# Patient Record
Sex: Female | Born: 1950 | Race: White | Hispanic: No | Marital: Married
Health system: Southern US, Community
[De-identification: ages and names within clinical notes are randomized; demographics above are authoritative.]

---

## 1997-12-22 ENCOUNTER — Ambulatory Visit (HOSPITAL_COMMUNITY): Admission: RE | Admit: 1997-12-22 | Discharge: 1997-12-22 | Payer: Self-pay | Admitting: Obstetrics and Gynecology

## 1999-05-24 ENCOUNTER — Encounter: Payer: Self-pay | Admitting: Obstetrics and Gynecology

## 1999-05-24 ENCOUNTER — Ambulatory Visit (HOSPITAL_COMMUNITY): Admission: RE | Admit: 1999-05-24 | Discharge: 1999-05-24 | Payer: Self-pay | Admitting: Obstetrics and Gynecology

## 2000-04-26 ENCOUNTER — Other Ambulatory Visit: Admission: RE | Admit: 2000-04-26 | Discharge: 2000-04-26 | Payer: Self-pay | Admitting: Obstetrics and Gynecology

## 2001-05-27 ENCOUNTER — Other Ambulatory Visit: Admission: RE | Admit: 2001-05-27 | Discharge: 2001-05-27 | Payer: Self-pay | Admitting: Obstetrics and Gynecology

## 2010-02-10 ENCOUNTER — Encounter
Admission: RE | Admit: 2010-02-10 | Discharge: 2010-02-10 | Payer: Self-pay | Source: Home / Self Care | Attending: Family Medicine | Admitting: Family Medicine

## 2010-02-18 ENCOUNTER — Encounter
Admission: RE | Admit: 2010-02-18 | Discharge: 2010-02-18 | Payer: Self-pay | Source: Home / Self Care | Attending: Family Medicine | Admitting: Family Medicine

## 2011-05-16 ENCOUNTER — Other Ambulatory Visit: Payer: Self-pay | Admitting: Family Medicine

## 2011-05-16 DIAGNOSIS — Z1231 Encounter for screening mammogram for malignant neoplasm of breast: Secondary | ICD-10-CM

## 2011-05-22 ENCOUNTER — Ambulatory Visit
Admission: RE | Admit: 2011-05-22 | Discharge: 2011-05-22 | Disposition: A | Discharge status: Home / Self Care | Payer: BC Managed Care – PPO | Source: Ambulatory Visit | Attending: Family Medicine | Admitting: Family Medicine

## 2011-05-22 DIAGNOSIS — Z1231 Encounter for screening mammogram for malignant neoplasm of breast: Secondary | ICD-10-CM

## 2012-04-29 ENCOUNTER — Other Ambulatory Visit: Payer: Self-pay

## 2012-04-29 DIAGNOSIS — Z1231 Encounter for screening mammogram for malignant neoplasm of breast: Secondary | ICD-10-CM

## 2012-06-11 ENCOUNTER — Ambulatory Visit
Admission: RE | Admit: 2012-06-11 | Discharge: 2012-06-11 | Disposition: A | Discharge status: Home / Self Care | Payer: BC Managed Care – PPO | Source: Ambulatory Visit

## 2012-06-11 DIAGNOSIS — Z1231 Encounter for screening mammogram for malignant neoplasm of breast: Secondary | ICD-10-CM

## 2012-06-18 ENCOUNTER — Other Ambulatory Visit: Payer: Self-pay | Admitting: Family Medicine

## 2012-06-18 ENCOUNTER — Ambulatory Visit
Admission: RE | Admit: 2012-06-18 | Discharge: 2012-06-18 | Disposition: A | Discharge status: Home / Self Care | Payer: BC Managed Care – PPO | Source: Ambulatory Visit | Attending: Family Medicine | Admitting: Family Medicine

## 2012-06-18 DIAGNOSIS — R52 Pain, unspecified: Secondary | ICD-10-CM

## 2012-06-18 DIAGNOSIS — R609 Edema, unspecified: Secondary | ICD-10-CM

## 2013-05-22 ENCOUNTER — Other Ambulatory Visit: Payer: Self-pay

## 2013-05-22 DIAGNOSIS — Z1231 Encounter for screening mammogram for malignant neoplasm of breast: Secondary | ICD-10-CM

## 2013-05-26 ENCOUNTER — Encounter (INDEPENDENT_AMBULATORY_CARE_PROVIDER_SITE_OTHER): Payer: BC Managed Care – PPO | Admitting: Ophthalmology

## 2013-05-26 DIAGNOSIS — H2510 Age-related nuclear cataract, unspecified eye: Secondary | ICD-10-CM

## 2013-05-26 DIAGNOSIS — H43819 Vitreous degeneration, unspecified eye: Secondary | ICD-10-CM

## 2013-05-26 DIAGNOSIS — H35449 Age-related reticular degeneration of retina, unspecified eye: Secondary | ICD-10-CM

## 2013-06-16 ENCOUNTER — Ambulatory Visit
Admission: RE | Admit: 2013-06-16 | Discharge: 2013-06-16 | Disposition: A | Discharge status: Home / Self Care | Payer: BC Managed Care – PPO | Source: Ambulatory Visit

## 2013-06-16 ENCOUNTER — Encounter (INDEPENDENT_AMBULATORY_CARE_PROVIDER_SITE_OTHER): Payer: Self-pay

## 2013-06-16 DIAGNOSIS — Z1231 Encounter for screening mammogram for malignant neoplasm of breast: Secondary | ICD-10-CM

## 2013-11-24 ENCOUNTER — Ambulatory Visit (INDEPENDENT_AMBULATORY_CARE_PROVIDER_SITE_OTHER): Payer: BC Managed Care – PPO | Admitting: Ophthalmology

## 2014-02-02 ENCOUNTER — Ambulatory Visit
Admission: RE | Admit: 2014-02-02 | Discharge: 2014-02-02 | Disposition: A | Discharge status: Home / Self Care | Payer: BC Managed Care – PPO | Source: Ambulatory Visit | Attending: Family Medicine | Admitting: Family Medicine

## 2014-02-02 ENCOUNTER — Other Ambulatory Visit: Payer: Self-pay | Admitting: Family Medicine

## 2014-02-02 DIAGNOSIS — R05 Cough: Secondary | ICD-10-CM

## 2014-02-02 DIAGNOSIS — R059 Cough, unspecified: Secondary | ICD-10-CM

## 2014-07-01 DIAGNOSIS — J309 Allergic rhinitis, unspecified: Secondary | ICD-10-CM | POA: Diagnosis not present

## 2014-07-27 DIAGNOSIS — M2022 Hallux rigidus, left foot: Secondary | ICD-10-CM | POA: Diagnosis not present

## 2014-07-29 DIAGNOSIS — Z131 Encounter for screening for diabetes mellitus: Secondary | ICD-10-CM | POA: Diagnosis not present

## 2014-07-29 DIAGNOSIS — Z136 Encounter for screening for cardiovascular disorders: Secondary | ICD-10-CM | POA: Diagnosis not present

## 2014-07-29 DIAGNOSIS — Z0000 Encounter for general adult medical examination without abnormal findings: Secondary | ICD-10-CM | POA: Diagnosis not present

## 2014-10-15 DIAGNOSIS — Z0000 Encounter for general adult medical examination without abnormal findings: Secondary | ICD-10-CM | POA: Diagnosis not present

## 2014-10-23 DIAGNOSIS — Z1211 Encounter for screening for malignant neoplasm of colon: Secondary | ICD-10-CM | POA: Diagnosis not present

## 2014-11-03 DIAGNOSIS — H40033 Anatomical narrow angle, bilateral: Secondary | ICD-10-CM | POA: Diagnosis not present

## 2014-12-23 DIAGNOSIS — Z23 Encounter for immunization: Secondary | ICD-10-CM | POA: Diagnosis not present

## 2015-01-20 ENCOUNTER — Other Ambulatory Visit: Payer: Self-pay

## 2015-01-20 DIAGNOSIS — Z1231 Encounter for screening mammogram for malignant neoplasm of breast: Secondary | ICD-10-CM

## 2015-02-18 DIAGNOSIS — H33101 Unspecified retinoschisis, right eye: Secondary | ICD-10-CM | POA: Diagnosis not present

## 2015-02-18 DIAGNOSIS — H43391 Other vitreous opacities, right eye: Secondary | ICD-10-CM | POA: Diagnosis not present

## 2015-02-18 DIAGNOSIS — H40033 Anatomical narrow angle, bilateral: Secondary | ICD-10-CM | POA: Diagnosis not present

## 2015-02-18 DIAGNOSIS — H2513 Age-related nuclear cataract, bilateral: Secondary | ICD-10-CM | POA: Diagnosis not present

## 2015-02-19 ENCOUNTER — Ambulatory Visit
Admission: RE | Admit: 2015-02-19 | Discharge: 2015-02-19 | Disposition: A | Discharge status: Home / Self Care | Payer: Medicare Other | Source: Ambulatory Visit

## 2015-02-19 DIAGNOSIS — Z1231 Encounter for screening mammogram for malignant neoplasm of breast: Secondary | ICD-10-CM | POA: Diagnosis not present

## 2015-12-08 DIAGNOSIS — Z23 Encounter for immunization: Secondary | ICD-10-CM | POA: Diagnosis not present

## 2015-12-08 DIAGNOSIS — M79672 Pain in left foot: Secondary | ICD-10-CM | POA: Diagnosis not present

## 2015-12-17 DIAGNOSIS — M722 Plantar fascial fibromatosis: Secondary | ICD-10-CM | POA: Diagnosis not present

## 2016-01-13 DIAGNOSIS — J209 Acute bronchitis, unspecified: Secondary | ICD-10-CM | POA: Diagnosis not present

## 2016-03-14 ENCOUNTER — Other Ambulatory Visit: Payer: Self-pay | Admitting: Family Medicine

## 2016-03-14 DIAGNOSIS — Z1231 Encounter for screening mammogram for malignant neoplasm of breast: Secondary | ICD-10-CM

## 2016-03-20 DIAGNOSIS — H524 Presbyopia: Secondary | ICD-10-CM | POA: Diagnosis not present

## 2016-03-20 DIAGNOSIS — H40013 Open angle with borderline findings, low risk, bilateral: Secondary | ICD-10-CM | POA: Diagnosis not present

## 2016-03-20 DIAGNOSIS — H40033 Anatomical narrow angle, bilateral: Secondary | ICD-10-CM | POA: Diagnosis not present

## 2016-03-20 DIAGNOSIS — H04123 Dry eye syndrome of bilateral lacrimal glands: Secondary | ICD-10-CM | POA: Diagnosis not present

## 2016-04-05 ENCOUNTER — Ambulatory Visit
Admission: RE | Admit: 2016-04-05 | Discharge: 2016-04-05 | Disposition: A | Discharge status: Home / Self Care | Payer: Medicare Other | Source: Ambulatory Visit | Attending: Family Medicine | Admitting: Family Medicine

## 2016-04-05 DIAGNOSIS — Z1231 Encounter for screening mammogram for malignant neoplasm of breast: Secondary | ICD-10-CM | POA: Diagnosis not present

## 2016-05-10 DIAGNOSIS — J029 Acute pharyngitis, unspecified: Secondary | ICD-10-CM | POA: Diagnosis not present

## 2016-05-17 DIAGNOSIS — R5383 Other fatigue: Secondary | ICD-10-CM | POA: Diagnosis not present

## 2016-05-17 DIAGNOSIS — Z0000 Encounter for general adult medical examination without abnormal findings: Secondary | ICD-10-CM | POA: Diagnosis not present

## 2016-05-17 DIAGNOSIS — M8580 Other specified disorders of bone density and structure, unspecified site: Secondary | ICD-10-CM | POA: Diagnosis not present

## 2016-05-18 ENCOUNTER — Other Ambulatory Visit: Payer: Self-pay | Admitting: Family Medicine

## 2016-05-18 DIAGNOSIS — M8580 Other specified disorders of bone density and structure, unspecified site: Secondary | ICD-10-CM

## 2016-06-16 DIAGNOSIS — J988 Other specified respiratory disorders: Secondary | ICD-10-CM | POA: Diagnosis not present

## 2016-07-27 DIAGNOSIS — H43813 Vitreous degeneration, bilateral: Secondary | ICD-10-CM | POA: Diagnosis not present

## 2016-07-27 DIAGNOSIS — H40013 Open angle with borderline findings, low risk, bilateral: Secondary | ICD-10-CM | POA: Diagnosis not present

## 2016-07-27 DIAGNOSIS — H25013 Cortical age-related cataract, bilateral: Secondary | ICD-10-CM | POA: Diagnosis not present

## 2016-07-27 DIAGNOSIS — H2513 Age-related nuclear cataract, bilateral: Secondary | ICD-10-CM | POA: Diagnosis not present

## 2016-10-06 DIAGNOSIS — L239 Allergic contact dermatitis, unspecified cause: Secondary | ICD-10-CM | POA: Diagnosis not present

## 2016-10-06 DIAGNOSIS — L03113 Cellulitis of right upper limb: Secondary | ICD-10-CM | POA: Diagnosis not present

## 2016-10-06 DIAGNOSIS — B009 Herpesviral infection, unspecified: Secondary | ICD-10-CM | POA: Diagnosis not present

## 2016-11-06 ENCOUNTER — Ambulatory Visit
Admission: RE | Admit: 2016-11-06 | Discharge: 2016-11-06 | Disposition: A | Discharge status: Home / Self Care | Payer: Medicare Other | Source: Ambulatory Visit | Attending: Family Medicine | Admitting: Family Medicine

## 2016-11-06 DIAGNOSIS — M8580 Other specified disorders of bone density and structure, unspecified site: Secondary | ICD-10-CM

## 2016-11-06 DIAGNOSIS — M8589 Other specified disorders of bone density and structure, multiple sites: Secondary | ICD-10-CM | POA: Diagnosis not present

## 2016-11-06 DIAGNOSIS — Z780 Asymptomatic menopausal state: Secondary | ICD-10-CM | POA: Diagnosis not present

## 2016-11-13 DIAGNOSIS — Z23 Encounter for immunization: Secondary | ICD-10-CM | POA: Diagnosis not present

## 2016-12-26 ENCOUNTER — Ambulatory Visit
Admission: RE | Admit: 2016-12-26 | Discharge: 2016-12-26 | Disposition: A | Discharge status: Home / Self Care | Payer: Medicare Other | Source: Ambulatory Visit | Attending: Family Medicine | Admitting: Family Medicine

## 2016-12-26 ENCOUNTER — Other Ambulatory Visit: Payer: Self-pay | Admitting: Family Medicine

## 2016-12-26 DIAGNOSIS — S8992XA Unspecified injury of left lower leg, initial encounter: Secondary | ICD-10-CM | POA: Diagnosis not present

## 2016-12-26 DIAGNOSIS — M25562 Pain in left knee: Secondary | ICD-10-CM | POA: Diagnosis not present

## 2017-04-04 DIAGNOSIS — R69 Illness, unspecified: Secondary | ICD-10-CM | POA: Diagnosis not present

## 2017-04-09 ENCOUNTER — Other Ambulatory Visit: Payer: Self-pay | Admitting: Family Medicine

## 2017-04-09 DIAGNOSIS — Z1231 Encounter for screening mammogram for malignant neoplasm of breast: Secondary | ICD-10-CM

## 2017-04-13 DIAGNOSIS — 419620001 Death: Secondary | SNOMED CT | POA: Diagnosis not present

## 2017-04-13 DEATH — deceased

## 2017-04-26 DIAGNOSIS — R69 Illness, unspecified: Secondary | ICD-10-CM | POA: Diagnosis not present

## 2017-04-27 ENCOUNTER — Ambulatory Visit
Admission: RE | Admit: 2017-04-27 | Discharge: 2017-04-27 | Disposition: A | Discharge status: Home / Self Care | Payer: Medicare HMO | Source: Ambulatory Visit | Attending: Family Medicine | Admitting: Family Medicine

## 2017-04-27 DIAGNOSIS — Z1231 Encounter for screening mammogram for malignant neoplasm of breast: Secondary | ICD-10-CM | POA: Diagnosis not present

## 2017-05-21 DIAGNOSIS — Z1159 Encounter for screening for other viral diseases: Secondary | ICD-10-CM | POA: Diagnosis not present

## 2017-05-21 DIAGNOSIS — Z131 Encounter for screening for diabetes mellitus: Secondary | ICD-10-CM | POA: Diagnosis not present

## 2017-05-21 DIAGNOSIS — Z0000 Encounter for general adult medical examination without abnormal findings: Secondary | ICD-10-CM | POA: Diagnosis not present

## 2017-05-21 DIAGNOSIS — M25562 Pain in left knee: Secondary | ICD-10-CM | POA: Diagnosis not present

## 2017-06-04 DIAGNOSIS — Z8249 Family history of ischemic heart disease and other diseases of the circulatory system: Secondary | ICD-10-CM | POA: Diagnosis not present

## 2017-06-04 DIAGNOSIS — J302 Other seasonal allergic rhinitis: Secondary | ICD-10-CM | POA: Diagnosis not present

## 2017-06-04 DIAGNOSIS — R32 Unspecified urinary incontinence: Secondary | ICD-10-CM | POA: Diagnosis not present

## 2017-06-04 DIAGNOSIS — Z823 Family history of stroke: Secondary | ICD-10-CM | POA: Diagnosis not present

## 2017-06-04 DIAGNOSIS — R69 Illness, unspecified: Secondary | ICD-10-CM | POA: Diagnosis not present

## 2017-06-07 DIAGNOSIS — M238X2 Other internal derangements of left knee: Secondary | ICD-10-CM | POA: Diagnosis not present

## 2017-06-07 DIAGNOSIS — M2392 Unspecified internal derangement of left knee: Secondary | ICD-10-CM | POA: Diagnosis not present

## 2017-08-01 DIAGNOSIS — H40013 Open angle with borderline findings, low risk, bilateral: Secondary | ICD-10-CM | POA: Diagnosis not present

## 2017-08-01 DIAGNOSIS — H40033 Anatomical narrow angle, bilateral: Secondary | ICD-10-CM | POA: Diagnosis not present

## 2017-08-01 DIAGNOSIS — H2513 Age-related nuclear cataract, bilateral: Secondary | ICD-10-CM | POA: Diagnosis not present

## 2017-08-01 DIAGNOSIS — H25013 Cortical age-related cataract, bilateral: Secondary | ICD-10-CM | POA: Diagnosis not present

## 2017-09-17 DIAGNOSIS — Z1211 Encounter for screening for malignant neoplasm of colon: Secondary | ICD-10-CM | POA: Diagnosis not present

## 2017-09-17 DIAGNOSIS — K649 Unspecified hemorrhoids: Secondary | ICD-10-CM | POA: Diagnosis not present

## 2017-09-27 DIAGNOSIS — L821 Other seborrheic keratosis: Secondary | ICD-10-CM | POA: Diagnosis not present

## 2017-11-26 DIAGNOSIS — Z23 Encounter for immunization: Secondary | ICD-10-CM | POA: Diagnosis not present

## 2018-03-27 DIAGNOSIS — R69 Illness, unspecified: Secondary | ICD-10-CM | POA: Diagnosis not present

## 2018-04-23 DIAGNOSIS — R69 Illness, unspecified: Secondary | ICD-10-CM | POA: Diagnosis not present

## 2018-06-28 DIAGNOSIS — J029 Acute pharyngitis, unspecified: Secondary | ICD-10-CM | POA: Diagnosis not present

## 2018-06-28 DIAGNOSIS — H938X1 Other specified disorders of right ear: Secondary | ICD-10-CM | POA: Diagnosis not present

## 2018-07-15 DIAGNOSIS — H04123 Dry eye syndrome of bilateral lacrimal glands: Secondary | ICD-10-CM | POA: Diagnosis not present

## 2018-07-15 DIAGNOSIS — H40013 Open angle with borderline findings, low risk, bilateral: Secondary | ICD-10-CM | POA: Diagnosis not present

## 2018-07-15 DIAGNOSIS — H40033 Anatomical narrow angle, bilateral: Secondary | ICD-10-CM | POA: Diagnosis not present

## 2018-08-14 ENCOUNTER — Other Ambulatory Visit: Payer: Self-pay | Admitting: Family Medicine

## 2018-08-14 DIAGNOSIS — Z1231 Encounter for screening mammogram for malignant neoplasm of breast: Secondary | ICD-10-CM

## 2018-09-25 ENCOUNTER — Ambulatory Visit
Admission: RE | Admit: 2018-09-25 | Discharge: 2018-09-25 | Disposition: A | Discharge status: Home / Self Care | Payer: Medicare HMO | Source: Ambulatory Visit | Attending: Psychiatric/Mental Health | Admitting: Psychiatric/Mental Health

## 2018-09-25 ENCOUNTER — Other Ambulatory Visit: Payer: Self-pay

## 2018-09-25 DIAGNOSIS — Z1231 Encounter for screening mammogram for malignant neoplasm of breast: Secondary | ICD-10-CM | POA: Diagnosis not present

## 2018-10-13 DIAGNOSIS — R69 Illness, unspecified: Secondary | ICD-10-CM | POA: Diagnosis not present

## 2018-11-06 DIAGNOSIS — Z13220 Encounter for screening for lipoid disorders: Secondary | ICD-10-CM | POA: Diagnosis not present

## 2018-11-06 DIAGNOSIS — Z136 Encounter for screening for cardiovascular disorders: Secondary | ICD-10-CM | POA: Diagnosis not present

## 2018-11-06 DIAGNOSIS — Z0000 Encounter for general adult medical examination without abnormal findings: Secondary | ICD-10-CM | POA: Diagnosis not present

## 2018-11-13 DIAGNOSIS — H2513 Age-related nuclear cataract, bilateral: Secondary | ICD-10-CM | POA: Diagnosis not present

## 2018-11-13 DIAGNOSIS — H43813 Vitreous degeneration, bilateral: Secondary | ICD-10-CM | POA: Diagnosis not present

## 2018-11-13 DIAGNOSIS — H33101 Unspecified retinoschisis, right eye: Secondary | ICD-10-CM | POA: Diagnosis not present

## 2018-11-13 DIAGNOSIS — H40013 Open angle with borderline findings, low risk, bilateral: Secondary | ICD-10-CM | POA: Diagnosis not present

## 2018-11-22 DIAGNOSIS — H02883 Meibomian gland dysfunction of right eye, unspecified eyelid: Secondary | ICD-10-CM | POA: Diagnosis not present

## 2018-11-22 DIAGNOSIS — H02886 Meibomian gland dysfunction of left eye, unspecified eyelid: Secondary | ICD-10-CM | POA: Diagnosis not present

## 2018-11-22 DIAGNOSIS — H00021 Hordeolum internum right upper eyelid: Secondary | ICD-10-CM | POA: Diagnosis not present

## 2018-11-29 ENCOUNTER — Other Ambulatory Visit: Payer: Self-pay

## 2018-11-29 DIAGNOSIS — Z20822 Contact with and (suspected) exposure to covid-19: Secondary | ICD-10-CM

## 2018-11-29 DIAGNOSIS — Z20828 Contact with and (suspected) exposure to other viral communicable diseases: Secondary | ICD-10-CM | POA: Diagnosis not present

## 2018-12-01 LAB — NOVEL CORONAVIRUS, NAA: SARS-CoV-2, NAA: NOT DETECTED

## 2019-02-18 ENCOUNTER — Ambulatory Visit: Payer: Medicare HMO | Attending: Internal Medicine

## 2019-02-18 ENCOUNTER — Other Ambulatory Visit: Payer: Self-pay

## 2019-02-18 DIAGNOSIS — Z20822 Contact with and (suspected) exposure to covid-19: Secondary | ICD-10-CM

## 2019-02-19 LAB — NOVEL CORONAVIRUS, NAA: SARS-CoV-2, NAA: NOT DETECTED

## 2019-02-24 ENCOUNTER — Ambulatory Visit: Payer: Medicare HMO | Attending: Internal Medicine

## 2019-02-24 DIAGNOSIS — Z20822 Contact with and (suspected) exposure to covid-19: Secondary | ICD-10-CM

## 2019-02-25 LAB — NOVEL CORONAVIRUS, NAA: SARS-CoV-2, NAA: NOT DETECTED

## 2019-03-05 ENCOUNTER — Ambulatory Visit: Payer: Medicare Other | Attending: Internal Medicine

## 2019-03-05 DIAGNOSIS — Z23 Encounter for immunization: Secondary | ICD-10-CM | POA: Insufficient documentation

## 2019-03-05 NOTE — Progress Notes (Signed)
   Covid-19 Vaccination Clinic  Name:  Mckenzie Jackson    MRN: [id number] DOB: 08/29/1950  03/05/2019  Mckenzie Jackson was observed post Covid-19 immunization for 15 minutes without incidence. She was provided with Vaccine Information Sheet and instruction to access the V-Safe system.   Mckenzie Jackson was instructed to call 911 with any severe reactions post vaccine: Marland Kitchen Difficulty breathing  . Swelling of your face and throat  . A fast heartbeat  . A bad rash all over your body  . Dizziness and weakness    Immunizations Administered    Name Date Dose VIS Date Route   Pfizer COVID-19 Vaccine 03/05/2019  6:03 PM 0.3 mL 01/24/2019 Intramuscular   Manufacturer: [employer], [employer]   Lot: [id number]   NDC: [address]-9

## 2019-03-15 ENCOUNTER — Ambulatory Visit: Payer: Medicare HMO

## 2019-03-18 ENCOUNTER — Ambulatory Visit: Payer: Medicare HMO

## 2019-03-18 DIAGNOSIS — R69 Illness, unspecified: Secondary | ICD-10-CM | POA: Diagnosis not present

## 2019-03-26 ENCOUNTER — Ambulatory Visit: Payer: Medicare HMO | Attending: Internal Medicine

## 2019-03-26 DIAGNOSIS — Z23 Encounter for immunization: Secondary | ICD-10-CM | POA: Insufficient documentation

## 2019-03-26 NOTE — Progress Notes (Signed)
   Covid-19 Vaccination Clinic  Name:  Mckenzie Jackson    MRN: [id number] DOB: Dec 27, 1950  03/26/2019  Mckenzie Jackson was observed post Covid-19 immunization for 15 minutes without incidence. She was provided with Vaccine Information Sheet and instruction to access the V-Safe system.   Mckenzie Jackson was instructed to call 911 with any severe reactions post vaccine: Marland Kitchen Difficulty breathing  . Swelling of your face and throat  . A fast heartbeat  . A bad rash all over your body  . Dizziness and weakness    Immunizations Administered    Name Date Dose VIS Date Route   Pfizer COVID-19 Vaccine 03/26/2019  5:14 PM 0.3 mL 01/24/2019 Intramuscular   Manufacturer: [employer], [employer]   Lot: [id number]   NDC: [address]-8

## 2019-04-07 DIAGNOSIS — Z20828 Contact with and (suspected) exposure to other viral communicable diseases: Secondary | ICD-10-CM | POA: Diagnosis not present

## 2019-04-07 DIAGNOSIS — Z7189 Other specified counseling: Secondary | ICD-10-CM | POA: Diagnosis not present

## 2019-04-10 ENCOUNTER — Ambulatory Visit: Payer: Medicare HMO

## 2019-04-24 DIAGNOSIS — R69 Illness, unspecified: Secondary | ICD-10-CM | POA: Diagnosis not present

## 2019-04-26 DIAGNOSIS — R32 Unspecified urinary incontinence: Secondary | ICD-10-CM | POA: Diagnosis not present

## 2019-04-26 DIAGNOSIS — M1990 Unspecified osteoarthritis, unspecified site: Secondary | ICD-10-CM | POA: Diagnosis not present

## 2019-04-26 DIAGNOSIS — G8929 Other chronic pain: Secondary | ICD-10-CM | POA: Diagnosis not present

## 2019-07-22 DIAGNOSIS — M25551 Pain in right hip: Secondary | ICD-10-CM | POA: Diagnosis not present

## 2019-07-22 DIAGNOSIS — N3946 Mixed incontinence: Secondary | ICD-10-CM | POA: Diagnosis not present

## 2019-08-05 DIAGNOSIS — R69 Illness, unspecified: Secondary | ICD-10-CM | POA: Diagnosis not present

## 2019-08-14 DIAGNOSIS — H00014 Hordeolum externum left upper eyelid: Secondary | ICD-10-CM | POA: Diagnosis not present

## 2019-08-14 DIAGNOSIS — H6690 Otitis media, unspecified, unspecified ear: Secondary | ICD-10-CM | POA: Diagnosis not present

## 2019-08-22 ENCOUNTER — Other Ambulatory Visit: Payer: Self-pay | Admitting: Family Medicine

## 2019-08-22 DIAGNOSIS — Z1231 Encounter for screening mammogram for malignant neoplasm of breast: Secondary | ICD-10-CM

## 2019-09-03 DIAGNOSIS — N393 Stress incontinence (female) (male): Secondary | ICD-10-CM | POA: Diagnosis not present

## 2019-09-08 DIAGNOSIS — R69 Illness, unspecified: Secondary | ICD-10-CM | POA: Diagnosis not present

## 2019-10-10 ENCOUNTER — Ambulatory Visit: Payer: Medicare HMO

## 2019-10-14 ENCOUNTER — Ambulatory Visit
Admission: RE | Admit: 2019-10-14 | Discharge: 2019-10-14 | Disposition: A | Discharge status: Home / Self Care | Payer: Medicare HMO | Source: Ambulatory Visit | Attending: Family Medicine | Admitting: Family Medicine

## 2019-10-14 ENCOUNTER — Other Ambulatory Visit: Payer: Self-pay

## 2019-10-14 DIAGNOSIS — Z1231 Encounter for screening mammogram for malignant neoplasm of breast: Secondary | ICD-10-CM

## 2019-11-03 DIAGNOSIS — H2513 Age-related nuclear cataract, bilateral: Secondary | ICD-10-CM | POA: Diagnosis not present

## 2019-11-03 DIAGNOSIS — H40033 Anatomical narrow angle, bilateral: Secondary | ICD-10-CM | POA: Diagnosis not present

## 2019-11-03 DIAGNOSIS — H40013 Open angle with borderline findings, low risk, bilateral: Secondary | ICD-10-CM | POA: Diagnosis not present

## 2019-11-03 DIAGNOSIS — H04123 Dry eye syndrome of bilateral lacrimal glands: Secondary | ICD-10-CM | POA: Diagnosis not present

## 2019-11-17 DIAGNOSIS — N958 Other specified menopausal and perimenopausal disorders: Secondary | ICD-10-CM | POA: Diagnosis not present

## 2019-11-17 DIAGNOSIS — N393 Stress incontinence (female) (male): Secondary | ICD-10-CM | POA: Diagnosis not present

## 2019-11-17 DIAGNOSIS — N9489 Other specified conditions associated with female genital organs and menstrual cycle: Secondary | ICD-10-CM | POA: Diagnosis not present

## 2019-11-18 ENCOUNTER — Ambulatory Visit: Payer: Medicare HMO | Attending: Internal Medicine

## 2019-11-18 DIAGNOSIS — Z23 Encounter for immunization: Secondary | ICD-10-CM

## 2019-11-18 NOTE — Progress Notes (Signed)
   Covid-19 Vaccination Clinic  Name:  Mckenzie Jackson    MRN: [id number] DOB: 07/04/1950  11/18/2019  Ms. Mckenzie Jackson was observed post Covid-19 immunization for 15 minutes without incident. She was provided with Vaccine Information Sheet and instruction to access the V-Safe system.   Ms. Mckenzie Jackson was instructed to call 911 with any severe reactions post vaccine: Marland Kitchen Difficulty breathing  . Swelling of face and throat  . A fast heartbeat  . A bad rash all over body  . Dizziness and weakness      Cov[facility]-19 Vaccination Clinic  Name:  Mckenzie Jackson    MRN: [id number] DOB: 04/16/1950  11/18/2019  Ms. Mckenzie Jackson was observed post Covid-19 immunization for 15 minutes without incident. She was provided with Vaccine Information Sheet and instruction to access the V-Safe system.   Ms. Mckenzie Jackson was instructed to call 911 with any severe reactions post vaccine: Marland Kitchen Difficulty breathing  . Swelling of face and throat  . A fast heartbeat  . A bad rash all over body  . Dizziness and weakness

## 2019-11-21 DIAGNOSIS — Z23 Encounter for immunization: Secondary | ICD-10-CM | POA: Diagnosis not present

## 2019-12-04 DIAGNOSIS — M8580 Other specified disorders of bone density and structure, unspecified site: Secondary | ICD-10-CM | POA: Diagnosis not present

## 2019-12-04 DIAGNOSIS — Z0000 Encounter for general adult medical examination without abnormal findings: Secondary | ICD-10-CM | POA: Diagnosis not present

## 2019-12-04 DIAGNOSIS — E785 Hyperlipidemia, unspecified: Secondary | ICD-10-CM | POA: Diagnosis not present

## 2019-12-15 DIAGNOSIS — N393 Stress incontinence (female) (male): Secondary | ICD-10-CM | POA: Diagnosis not present

## 2019-12-15 DIAGNOSIS — N949 Unspecified condition associated with female genital organs and menstrual cycle: Secondary | ICD-10-CM | POA: Diagnosis not present

## 2019-12-15 DIAGNOSIS — R69 Illness, unspecified: Secondary | ICD-10-CM | POA: Diagnosis not present

## 2019-12-16 ENCOUNTER — Other Ambulatory Visit: Payer: Self-pay | Admitting: Family Medicine

## 2019-12-16 DIAGNOSIS — M8580 Other specified disorders of bone density and structure, unspecified site: Secondary | ICD-10-CM

## 2019-12-25 DIAGNOSIS — R399 Unspecified symptoms and signs involving the genitourinary system: Secondary | ICD-10-CM | POA: Diagnosis not present

## 2020-01-01 DIAGNOSIS — N958 Other specified menopausal and perimenopausal disorders: Secondary | ICD-10-CM | POA: Diagnosis not present

## 2020-01-01 DIAGNOSIS — N393 Stress incontinence (female) (male): Secondary | ICD-10-CM | POA: Diagnosis not present

## 2020-01-01 DIAGNOSIS — N8184 Pelvic muscle wasting: Secondary | ICD-10-CM | POA: Diagnosis not present

## 2020-01-01 DIAGNOSIS — Z4689 Encounter for fitting and adjustment of other specified devices: Secondary | ICD-10-CM | POA: Diagnosis not present

## 2020-01-22 DIAGNOSIS — Z4689 Encounter for fitting and adjustment of other specified devices: Secondary | ICD-10-CM | POA: Diagnosis not present

## 2020-01-22 DIAGNOSIS — N393 Stress incontinence (female) (male): Secondary | ICD-10-CM | POA: Diagnosis not present

## 2020-03-29 ENCOUNTER — Other Ambulatory Visit: Payer: Medicare HMO

## 2020-04-01 DIAGNOSIS — R32 Unspecified urinary incontinence: Secondary | ICD-10-CM | POA: Diagnosis not present

## 2020-04-01 DIAGNOSIS — E663 Overweight: Secondary | ICD-10-CM | POA: Diagnosis not present

## 2020-04-01 DIAGNOSIS — Z6826 Body mass index (BMI) 26.0-26.9, adult: Secondary | ICD-10-CM | POA: Diagnosis not present

## 2020-08-22 DIAGNOSIS — R399 Unspecified symptoms and signs involving the genitourinary system: Secondary | ICD-10-CM | POA: Diagnosis not present

## 2020-08-26 ENCOUNTER — Other Ambulatory Visit: Payer: Medicare HMO

## 2020-09-08 ENCOUNTER — Ambulatory Visit
Admission: RE | Admit: 2020-09-08 | Discharge: 2020-09-08 | Disposition: A | Discharge status: Home / Self Care | Payer: Medicare HMO | Source: Ambulatory Visit | Attending: Family Medicine | Admitting: Family Medicine

## 2020-09-08 ENCOUNTER — Other Ambulatory Visit: Payer: Self-pay | Admitting: Family Medicine

## 2020-09-08 ENCOUNTER — Other Ambulatory Visit: Payer: Self-pay

## 2020-09-08 DIAGNOSIS — M810 Age-related osteoporosis without current pathological fracture: Secondary | ICD-10-CM | POA: Diagnosis not present

## 2020-09-08 DIAGNOSIS — M8580 Other specified disorders of bone density and structure, unspecified site: Secondary | ICD-10-CM

## 2020-09-08 DIAGNOSIS — Z1231 Encounter for screening mammogram for malignant neoplasm of breast: Secondary | ICD-10-CM

## 2020-10-21 DIAGNOSIS — H40033 Anatomical narrow angle, bilateral: Secondary | ICD-10-CM | POA: Diagnosis not present

## 2020-10-21 DIAGNOSIS — H40013 Open angle with borderline findings, low risk, bilateral: Secondary | ICD-10-CM | POA: Diagnosis not present

## 2020-10-23 DIAGNOSIS — Z0100 Encounter for examination of eyes and vision without abnormal findings: Secondary | ICD-10-CM | POA: Diagnosis not present

## 2020-10-28 ENCOUNTER — Ambulatory Visit
Admission: RE | Admit: 2020-10-28 | Discharge: 2020-10-28 | Disposition: A | Discharge status: Home / Self Care | Payer: Medicare HMO | Source: Ambulatory Visit | Attending: Family Medicine | Admitting: Family Medicine

## 2020-10-28 ENCOUNTER — Other Ambulatory Visit: Payer: Self-pay

## 2020-10-28 DIAGNOSIS — Z1231 Encounter for screening mammogram for malignant neoplasm of breast: Secondary | ICD-10-CM | POA: Diagnosis not present

## 2020-12-16 DIAGNOSIS — M810 Age-related osteoporosis without current pathological fracture: Secondary | ICD-10-CM | POA: Diagnosis not present

## 2020-12-16 DIAGNOSIS — E785 Hyperlipidemia, unspecified: Secondary | ICD-10-CM | POA: Diagnosis not present

## 2020-12-16 DIAGNOSIS — M7502 Adhesive capsulitis of left shoulder: Secondary | ICD-10-CM | POA: Diagnosis not present

## 2020-12-16 DIAGNOSIS — Z0000 Encounter for general adult medical examination without abnormal findings: Secondary | ICD-10-CM | POA: Diagnosis not present

## 2020-12-28 DIAGNOSIS — M25512 Pain in left shoulder: Secondary | ICD-10-CM | POA: Diagnosis not present

## 2021-02-01 DIAGNOSIS — M25512 Pain in left shoulder: Secondary | ICD-10-CM | POA: Diagnosis not present

## 2021-02-14 DIAGNOSIS — J069 Acute upper respiratory infection, unspecified: Secondary | ICD-10-CM | POA: Diagnosis not present

## 2021-02-20 DIAGNOSIS — J40 Bronchitis, not specified as acute or chronic: Secondary | ICD-10-CM | POA: Diagnosis not present

## 2021-02-20 DIAGNOSIS — J069 Acute upper respiratory infection, unspecified: Secondary | ICD-10-CM | POA: Diagnosis not present

## 2021-06-07 DIAGNOSIS — R5383 Other fatigue: Secondary | ICD-10-CM | POA: Diagnosis not present

## 2021-06-07 DIAGNOSIS — R52 Pain, unspecified: Secondary | ICD-10-CM | POA: Diagnosis not present

## 2021-06-07 DIAGNOSIS — R0981 Nasal congestion: Secondary | ICD-10-CM | POA: Diagnosis not present

## 2021-06-07 DIAGNOSIS — J029 Acute pharyngitis, unspecified: Secondary | ICD-10-CM | POA: Diagnosis not present

## 2021-06-07 DIAGNOSIS — R059 Cough, unspecified: Secondary | ICD-10-CM | POA: Diagnosis not present

## 2021-07-08 DIAGNOSIS — M25552 Pain in left hip: Secondary | ICD-10-CM | POA: Diagnosis not present

## 2021-07-13 DIAGNOSIS — M25552 Pain in left hip: Secondary | ICD-10-CM | POA: Diagnosis not present

## 2021-07-19 DIAGNOSIS — H01009 Unspecified blepharitis unspecified eye, unspecified eyelid: Secondary | ICD-10-CM | POA: Diagnosis not present

## 2021-09-13 DIAGNOSIS — I788 Other diseases of capillaries: Secondary | ICD-10-CM | POA: Diagnosis not present

## 2021-10-11 ENCOUNTER — Other Ambulatory Visit: Payer: Self-pay | Admitting: Family Medicine

## 2021-10-11 DIAGNOSIS — Z1231 Encounter for screening mammogram for malignant neoplasm of breast: Secondary | ICD-10-CM

## 2021-10-20 DIAGNOSIS — H40013 Open angle with borderline findings, low risk, bilateral: Secondary | ICD-10-CM | POA: Diagnosis not present

## 2021-10-20 DIAGNOSIS — H2513 Age-related nuclear cataract, bilateral: Secondary | ICD-10-CM | POA: Diagnosis not present

## 2021-10-20 DIAGNOSIS — H40033 Anatomical narrow angle, bilateral: Secondary | ICD-10-CM | POA: Diagnosis not present

## 2021-10-20 DIAGNOSIS — H25013 Cortical age-related cataract, bilateral: Secondary | ICD-10-CM | POA: Diagnosis not present

## 2021-11-03 ENCOUNTER — Ambulatory Visit
Admission: RE | Admit: 2021-11-03 | Discharge: 2021-11-03 | Disposition: A | Discharge status: Home / Self Care | Payer: Medicare Other | Source: Ambulatory Visit | Attending: Family Medicine | Admitting: Family Medicine

## 2021-11-03 DIAGNOSIS — Z1231 Encounter for screening mammogram for malignant neoplasm of breast: Secondary | ICD-10-CM

## 2022-01-09 DIAGNOSIS — M810 Age-related osteoporosis without current pathological fracture: Secondary | ICD-10-CM | POA: Diagnosis not present

## 2022-01-09 DIAGNOSIS — Z0000 Encounter for general adult medical examination without abnormal findings: Secondary | ICD-10-CM | POA: Diagnosis not present

## 2022-01-09 DIAGNOSIS — Z79899 Other long term (current) drug therapy: Secondary | ICD-10-CM | POA: Diagnosis not present

## 2022-01-09 DIAGNOSIS — E785 Hyperlipidemia, unspecified: Secondary | ICD-10-CM | POA: Diagnosis not present

## 2022-02-27 DIAGNOSIS — K080 Exfoliation of teeth due to systemic causes: Secondary | ICD-10-CM | POA: Diagnosis not present

## 2022-04-04 DIAGNOSIS — L853 Xerosis cutis: Secondary | ICD-10-CM | POA: Diagnosis not present

## 2022-04-04 DIAGNOSIS — D2261 Melanocytic nevi of right upper limb, including shoulder: Secondary | ICD-10-CM | POA: Diagnosis not present

## 2022-04-04 DIAGNOSIS — D2262 Melanocytic nevi of left upper limb, including shoulder: Secondary | ICD-10-CM | POA: Diagnosis not present

## 2022-04-04 DIAGNOSIS — D225 Melanocytic nevi of trunk: Secondary | ICD-10-CM | POA: Diagnosis not present

## 2022-07-03 DIAGNOSIS — E785 Hyperlipidemia, unspecified: Secondary | ICD-10-CM | POA: Diagnosis not present

## 2022-07-15 IMAGING — MG MM DIGITAL SCREENING BILAT W/ TOMO AND CAD
8 series · 8 of 24 positions shown · non-contrast
Comparison: Previous exam(s).

CLINICAL DATA: Screening.

EXAM:
DIGITAL SCREENING BILATERAL MAMMOGRAM WITH TOMOSYNTHESIS AND CAD
TECHNIQUE: Bilateral screening digital craniocaudal and mediolateral oblique
mammograms were obtained. Bilateral screening digital breast
tomosynthesis was performed. The images were evaluated with
computer-aided detection.

[R CC synth-2D]
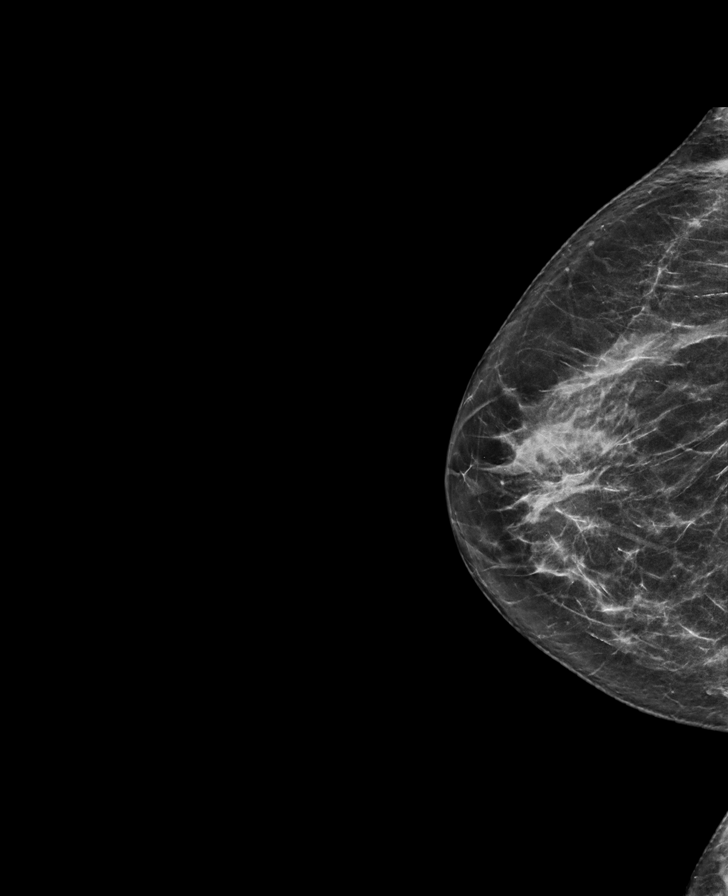

[L CC synth-2D]
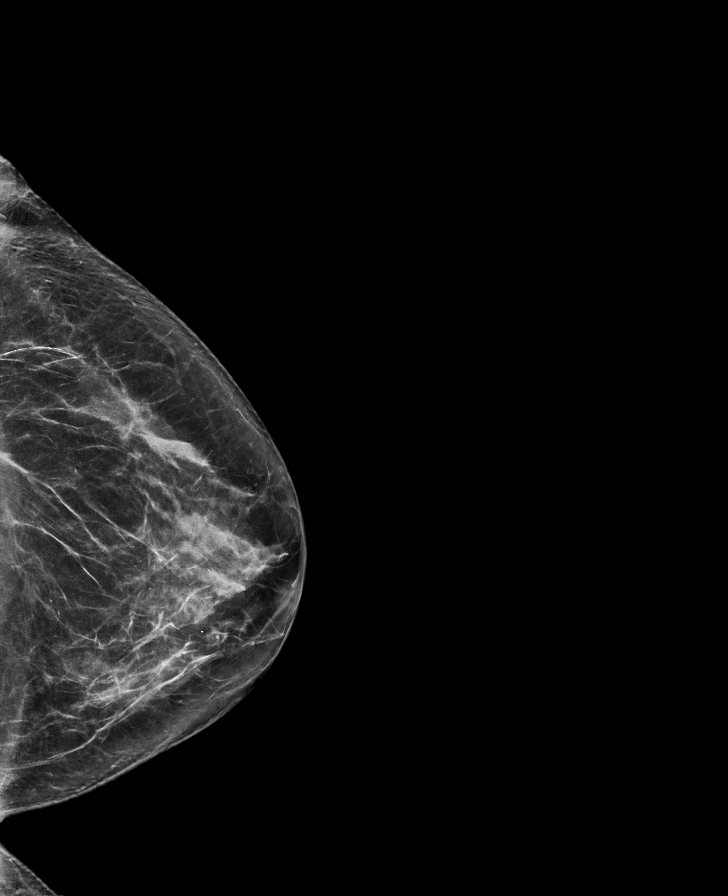

[L MLO synth-2D]
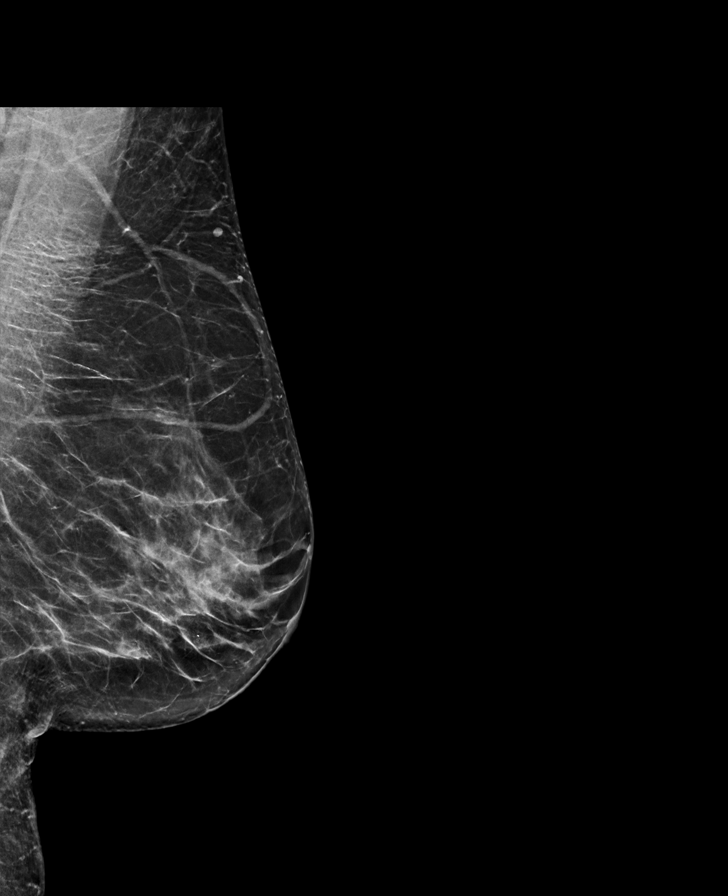

[R MLO synth-2D]
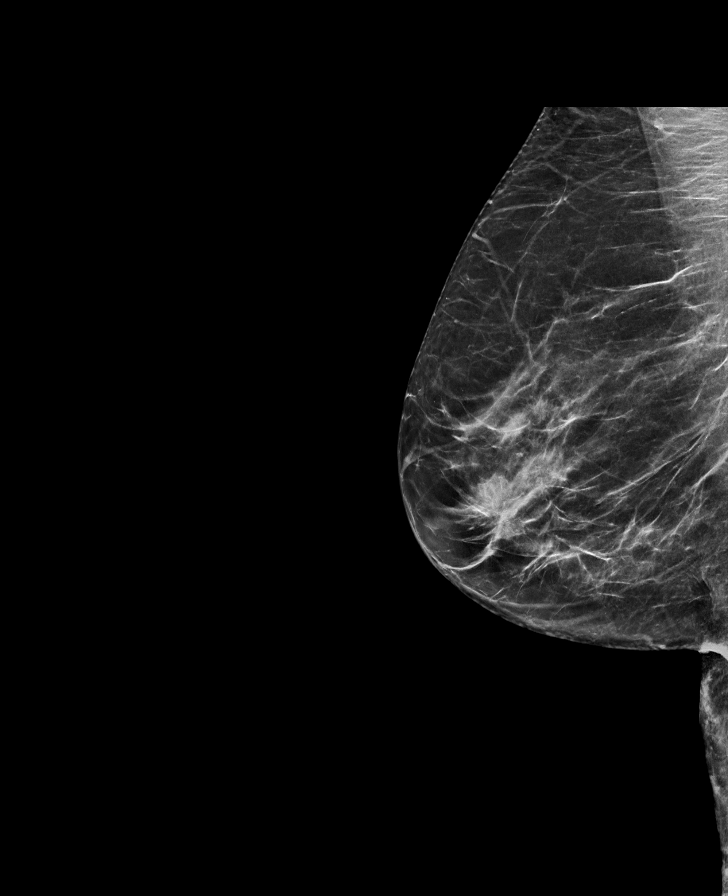

[R MLO tomo · tomo slice 34/67.0]
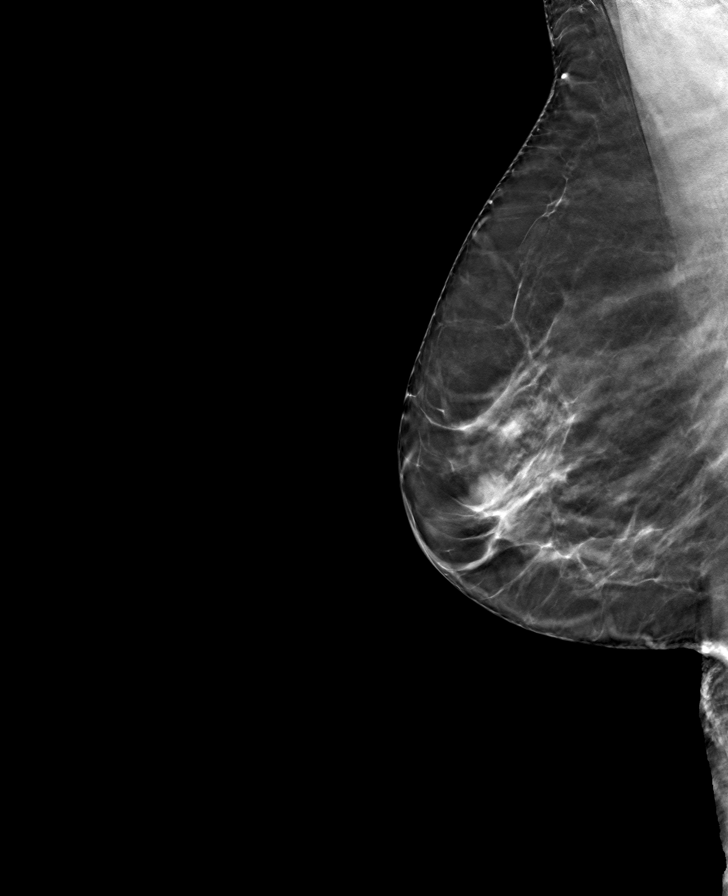

[L MLO tomo · tomo slice 35/69.0]
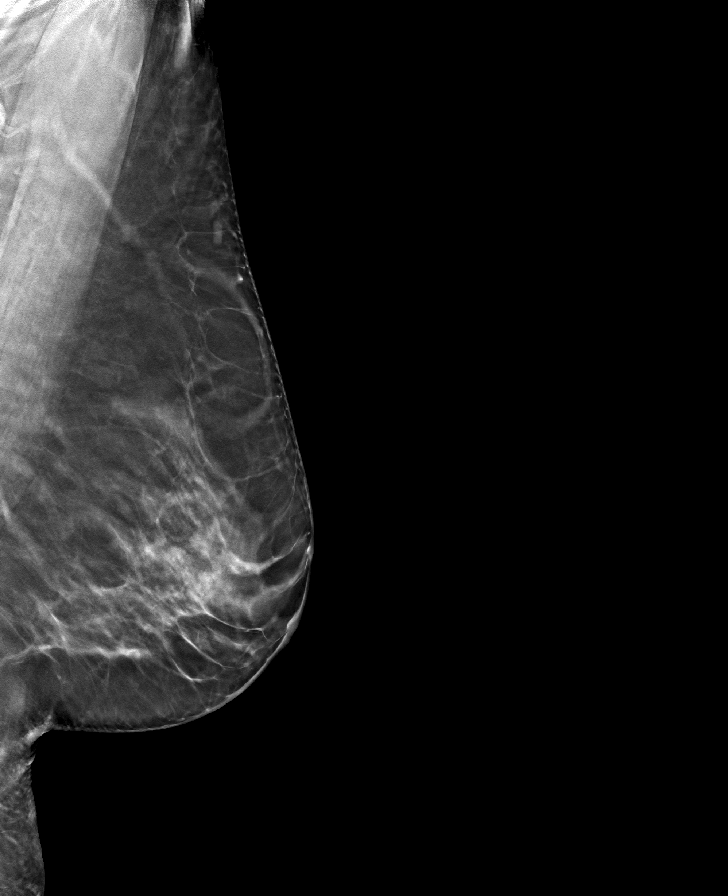

[L CC tomo · tomo slice 36/71.0]
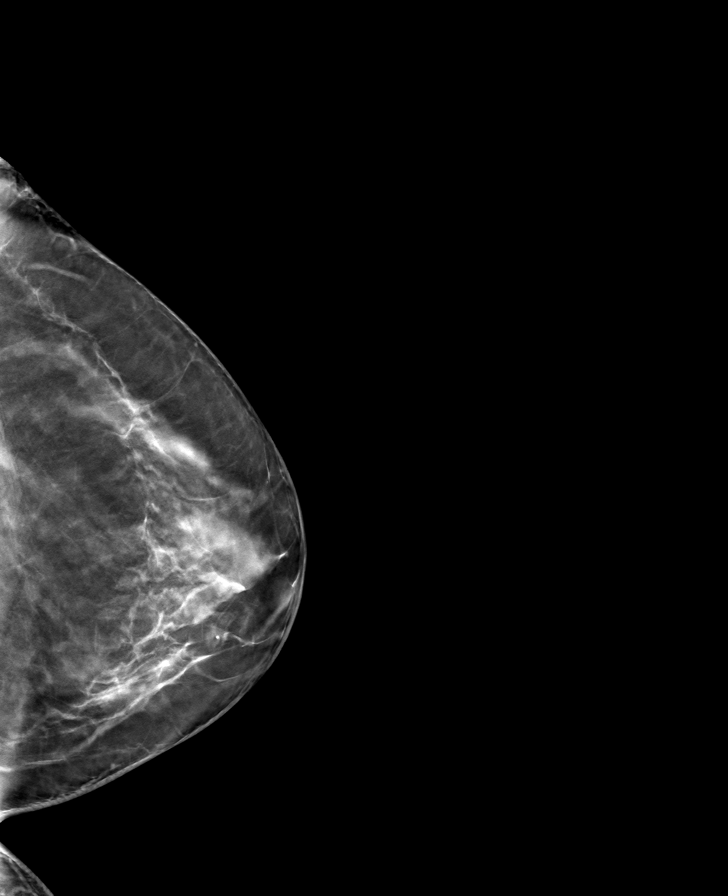

[R CC tomo · tomo slice 34/67.0]
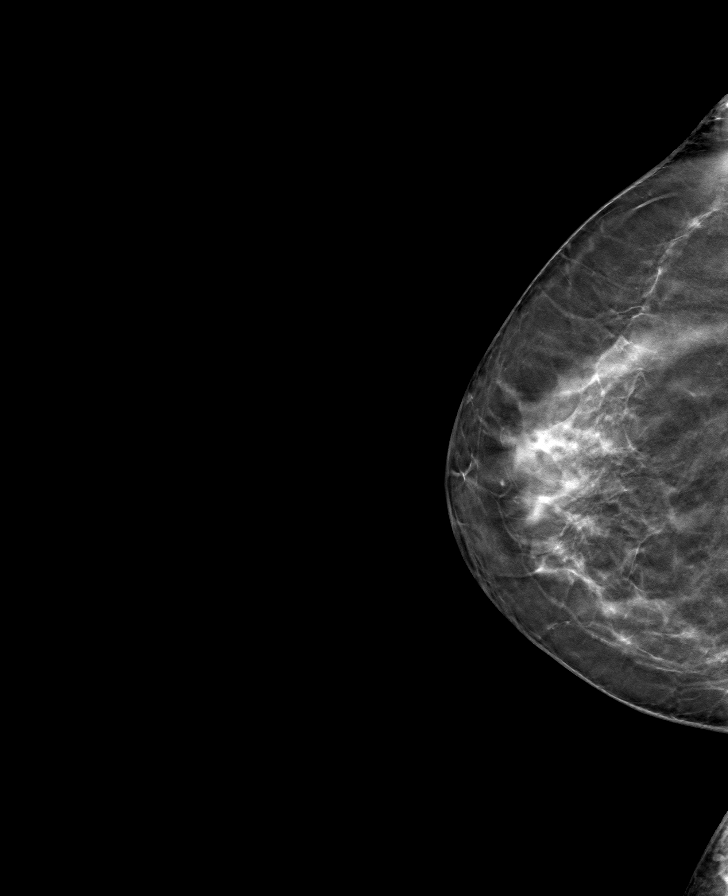

[8 of 24 positions shown; findings below may reference images not displayed]

ACR Breast Density Category c: The breast tissue is heterogeneously
dense, which may obscure small masses.
FINDINGS: There are no findings suspicious for malignancy.
IMPRESSION: No mammographic evidence of malignancy. A result letter of this
screening mammogram will be mailed directly to the patient.

RECOMMENDATION:
Screening mammogram in one year. (Code:[id number])

BI-RADS CATEGORY  1: Negative.

## 2022-08-02 DIAGNOSIS — K080 Exfoliation of teeth due to systemic causes: Secondary | ICD-10-CM | POA: Diagnosis not present

## 2022-10-23 DIAGNOSIS — H40013 Open angle with borderline findings, low risk, bilateral: Secondary | ICD-10-CM | POA: Diagnosis not present

## 2022-10-23 DIAGNOSIS — H04123 Dry eye syndrome of bilateral lacrimal glands: Secondary | ICD-10-CM | POA: Diagnosis not present

## 2022-10-23 DIAGNOSIS — H25813 Combined forms of age-related cataract, bilateral: Secondary | ICD-10-CM | POA: Diagnosis not present

## 2022-10-23 DIAGNOSIS — H40033 Anatomical narrow angle, bilateral: Secondary | ICD-10-CM | POA: Diagnosis not present

## 2022-10-30 ENCOUNTER — Other Ambulatory Visit: Payer: Self-pay | Admitting: Family Medicine

## 2022-10-30 DIAGNOSIS — Z1231 Encounter for screening mammogram for malignant neoplasm of breast: Secondary | ICD-10-CM

## 2022-11-17 ENCOUNTER — Ambulatory Visit
Admission: RE | Admit: 2022-11-17 | Discharge: 2022-11-17 | Disposition: A | Discharge status: Home / Self Care | Payer: Medicare Other | Source: Ambulatory Visit | Attending: Family Medicine

## 2022-11-17 DIAGNOSIS — Z1231 Encounter for screening mammogram for malignant neoplasm of breast: Secondary | ICD-10-CM | POA: Diagnosis not present

## 2023-01-15 DIAGNOSIS — Z0000 Encounter for general adult medical examination without abnormal findings: Secondary | ICD-10-CM | POA: Diagnosis not present

## 2023-01-15 DIAGNOSIS — E785 Hyperlipidemia, unspecified: Secondary | ICD-10-CM | POA: Diagnosis not present

## 2023-01-15 DIAGNOSIS — M810 Age-related osteoporosis without current pathological fracture: Secondary | ICD-10-CM | POA: Diagnosis not present

## 2023-01-15 DIAGNOSIS — Z79899 Other long term (current) drug therapy: Secondary | ICD-10-CM | POA: Diagnosis not present

## 2023-01-29 DIAGNOSIS — H524 Presbyopia: Secondary | ICD-10-CM | POA: Diagnosis not present

## 2023-01-29 DIAGNOSIS — H25813 Combined forms of age-related cataract, bilateral: Secondary | ICD-10-CM | POA: Diagnosis not present

## 2023-01-29 DIAGNOSIS — H40033 Anatomical narrow angle, bilateral: Secondary | ICD-10-CM | POA: Diagnosis not present

## 2023-01-29 DIAGNOSIS — H40013 Open angle with borderline findings, low risk, bilateral: Secondary | ICD-10-CM | POA: Diagnosis not present

## 2023-01-29 DIAGNOSIS — H04123 Dry eye syndrome of bilateral lacrimal glands: Secondary | ICD-10-CM | POA: Diagnosis not present

## 2023-02-08 ENCOUNTER — Other Ambulatory Visit: Payer: Self-pay | Admitting: Medical Genetics

## 2023-02-21 DIAGNOSIS — H2512 Age-related nuclear cataract, left eye: Secondary | ICD-10-CM | POA: Diagnosis not present

## 2023-02-21 DIAGNOSIS — H52212 Irregular astigmatism, left eye: Secondary | ICD-10-CM | POA: Diagnosis not present

## 2023-02-21 DIAGNOSIS — H25813 Combined forms of age-related cataract, bilateral: Secondary | ICD-10-CM | POA: Diagnosis not present

## 2023-03-02 ENCOUNTER — Other Ambulatory Visit (HOSPITAL_COMMUNITY)
Admission: RE | Admit: 2023-03-02 | Discharge: 2023-03-02 | Disposition: A | Discharge status: Home / Self Care | Payer: Self-pay | Source: Ambulatory Visit | Attending: Medical Genetics | Admitting: Medical Genetics

## 2023-03-15 LAB — GENECONNECT MOLECULAR SCREEN: Genetic Analysis Overall Interpretation: NEGATIVE

## 2023-03-21 DIAGNOSIS — Z961 Presence of intraocular lens: Secondary | ICD-10-CM | POA: Diagnosis not present

## 2023-03-21 DIAGNOSIS — H25811 Combined forms of age-related cataract, right eye: Secondary | ICD-10-CM | POA: Diagnosis not present

## 2023-03-21 DIAGNOSIS — H40031 Anatomical narrow angle, right eye: Secondary | ICD-10-CM | POA: Diagnosis not present

## 2023-03-29 DIAGNOSIS — K080 Exfoliation of teeth due to systemic causes: Secondary | ICD-10-CM | POA: Diagnosis not present

## 2023-04-11 DIAGNOSIS — H25811 Combined forms of age-related cataract, right eye: Secondary | ICD-10-CM | POA: Diagnosis not present

## 2023-04-11 DIAGNOSIS — H52211 Irregular astigmatism, right eye: Secondary | ICD-10-CM | POA: Diagnosis not present

## 2023-04-11 DIAGNOSIS — H2511 Age-related nuclear cataract, right eye: Secondary | ICD-10-CM | POA: Diagnosis not present

## 2023-07-31 DIAGNOSIS — M25552 Pain in left hip: Secondary | ICD-10-CM | POA: Diagnosis not present

## 2023-07-31 DIAGNOSIS — Z03818 Encounter for observation for suspected exposure to other biological agents ruled out: Secondary | ICD-10-CM | POA: Diagnosis not present

## 2023-07-31 DIAGNOSIS — R051 Acute cough: Secondary | ICD-10-CM | POA: Diagnosis not present

## 2023-07-31 DIAGNOSIS — J069 Acute upper respiratory infection, unspecified: Secondary | ICD-10-CM | POA: Diagnosis not present

## 2023-07-31 DIAGNOSIS — M25561 Pain in right knee: Secondary | ICD-10-CM | POA: Diagnosis not present

## 2023-07-31 DIAGNOSIS — J208 Acute bronchitis due to other specified organisms: Secondary | ICD-10-CM | POA: Diagnosis not present

## 2023-08-08 DIAGNOSIS — M25552 Pain in left hip: Secondary | ICD-10-CM | POA: Diagnosis not present

## 2023-08-14 DIAGNOSIS — M25552 Pain in left hip: Secondary | ICD-10-CM | POA: Diagnosis not present

## 2023-08-16 DIAGNOSIS — M25552 Pain in left hip: Secondary | ICD-10-CM | POA: Diagnosis not present

## 2023-08-21 DIAGNOSIS — M25552 Pain in left hip: Secondary | ICD-10-CM | POA: Diagnosis not present

## 2023-09-04 DIAGNOSIS — M25552 Pain in left hip: Secondary | ICD-10-CM | POA: Diagnosis not present

## 2023-09-06 DIAGNOSIS — M25552 Pain in left hip: Secondary | ICD-10-CM | POA: Diagnosis not present

## 2023-09-11 DIAGNOSIS — M25552 Pain in left hip: Secondary | ICD-10-CM | POA: Diagnosis not present

## 2023-09-24 DIAGNOSIS — M25562 Pain in left knee: Secondary | ICD-10-CM | POA: Diagnosis not present

## 2023-09-24 DIAGNOSIS — M25552 Pain in left hip: Secondary | ICD-10-CM | POA: Diagnosis not present

## 2023-09-25 DIAGNOSIS — M25552 Pain in left hip: Secondary | ICD-10-CM | POA: Diagnosis not present

## 2023-09-27 DIAGNOSIS — M25552 Pain in left hip: Secondary | ICD-10-CM | POA: Diagnosis not present

## 2023-10-11 DIAGNOSIS — M25552 Pain in left hip: Secondary | ICD-10-CM | POA: Diagnosis not present

## 2023-10-18 DIAGNOSIS — M25552 Pain in left hip: Secondary | ICD-10-CM | POA: Diagnosis not present

## 2023-10-22 DIAGNOSIS — K080 Exfoliation of teeth due to systemic causes: Secondary | ICD-10-CM | POA: Diagnosis not present

## 2023-10-23 DIAGNOSIS — M25552 Pain in left hip: Secondary | ICD-10-CM | POA: Diagnosis not present

## 2023-11-28 ENCOUNTER — Other Ambulatory Visit: Payer: Self-pay | Admitting: Family Medicine

## 2023-11-28 DIAGNOSIS — Z1231 Encounter for screening mammogram for malignant neoplasm of breast: Secondary | ICD-10-CM

## 2023-12-05 ENCOUNTER — Ambulatory Visit
Admission: RE | Admit: 2023-12-05 | Discharge: 2023-12-05 | Disposition: A | Discharge status: Home / Self Care | Source: Ambulatory Visit

## 2023-12-05 DIAGNOSIS — Z1231 Encounter for screening mammogram for malignant neoplasm of breast: Secondary | ICD-10-CM | POA: Diagnosis not present

## 2023-12-30 DIAGNOSIS — B349 Viral infection, unspecified: Secondary | ICD-10-CM | POA: Diagnosis not present

## 2023-12-30 DIAGNOSIS — R051 Acute cough: Secondary | ICD-10-CM | POA: Diagnosis not present

## 2023-12-30 DIAGNOSIS — R0981 Nasal congestion: Secondary | ICD-10-CM | POA: Diagnosis not present

## 2024-01-28 DIAGNOSIS — E785 Hyperlipidemia, unspecified: Secondary | ICD-10-CM | POA: Diagnosis not present

## 2024-01-28 DIAGNOSIS — Z79899 Other long term (current) drug therapy: Secondary | ICD-10-CM | POA: Diagnosis not present

## 2024-01-28 DIAGNOSIS — M810 Age-related osteoporosis without current pathological fracture: Secondary | ICD-10-CM | POA: Diagnosis not present

## 2024-01-28 DIAGNOSIS — K649 Unspecified hemorrhoids: Secondary | ICD-10-CM | POA: Diagnosis not present

## 2024-01-28 DIAGNOSIS — Z0000 Encounter for general adult medical examination without abnormal findings: Secondary | ICD-10-CM | POA: Diagnosis not present
